# Patient Record
Sex: Female | Born: 1990
Health system: Southern US, Community
[De-identification: ages and names within clinical notes are randomized; demographics above are authoritative.]

## PROBLEM LIST (undated history)

## (undated) DIAGNOSIS — A749 Chlamydial infection, unspecified: Secondary | ICD-10-CM

## (undated) DIAGNOSIS — Z8619 Personal history of other infectious and parasitic diseases: Secondary | ICD-10-CM

## (undated) HISTORY — DX: Personal history of other infectious and parasitic diseases: Z86.19

## (undated) HISTORY — DX: Chlamydial infection, unspecified: A74.9

---

## 2013-09-11 ENCOUNTER — Encounter (HOSPITAL_COMMUNITY): Payer: Self-pay | Admitting: Emergency Medicine

## 2013-09-11 ENCOUNTER — Emergency Department (HOSPITAL_COMMUNITY)
Admission: EM | Admit: 2013-09-11 | Discharge: 2013-09-11 | Disposition: A | Discharge status: Home / Self Care | Payer: 59 | Attending: Emergency Medicine | Admitting: Emergency Medicine

## 2013-09-11 DIAGNOSIS — A499 Bacterial infection, unspecified: Secondary | ICD-10-CM | POA: Insufficient documentation

## 2013-09-11 DIAGNOSIS — Z3202 Encounter for pregnancy test, result negative: Secondary | ICD-10-CM | POA: Insufficient documentation

## 2013-09-11 DIAGNOSIS — B9689 Other specified bacterial agents as the cause of diseases classified elsewhere: Secondary | ICD-10-CM | POA: Insufficient documentation

## 2013-09-11 DIAGNOSIS — N760 Acute vaginitis: Secondary | ICD-10-CM | POA: Insufficient documentation

## 2013-09-11 LAB — URINALYSIS, ROUTINE W REFLEX MICROSCOPIC
Bilirubin Urine: NEGATIVE
Glucose, UA: NEGATIVE mg/dL
Hgb urine dipstick: NEGATIVE
KETONES UR: NEGATIVE mg/dL
Leukocytes, UA: NEGATIVE
Nitrite: NEGATIVE
PROTEIN: NEGATIVE mg/dL
Specific Gravity, Urine: 1.025 (ref 1.005–1.030)
Urobilinogen, UA: 0.2 mg/dL (ref 0.0–1.0)
pH: 5.5 (ref 5.0–8.0)

## 2013-09-11 LAB — POC URINE PREG, ED: PREG TEST UR: NEGATIVE

## 2013-09-11 LAB — WET PREP, GENITAL
TRICH WET PREP: NONE SEEN
YEAST WET PREP: NONE SEEN

## 2013-09-11 MED ORDER — METRONIDAZOLE 500 MG PO TABS: 500.0000 mg | ORAL_TABLET | Freq: Two times a day (BID) | ORAL | Status: DC

## 2013-09-11 NOTE — ED Notes (Signed)
Pt c/o white vaginal discharge with odor and foul smelling urine; pt denies dysuria; sts LMP was in April

## 2013-09-11 NOTE — ED Provider Notes (Signed)
Medical screening examination/treatment/procedure(s) were performed by non-physician practitioner and as supervising physician I was immediately available for consultation/collaboration.   EKG Interpretation None        Gilda Creasehristopher J. Pollina, MD 09/11/13 1422

## 2013-09-11 NOTE — Discharge Instructions (Signed)
Take flagyl twice daily for the next 7 days. Do not have any alcohol intake or contact while taking this drug. Avoid sexual intercourse x 2 weeks.  Bacterial Vaginosis Bacterial vaginosis is a vaginal infection that occurs when the normal balance of bacteria in the vagina is disrupted. It results from an overgrowth of certain bacteria. This is the most common vaginal infection in women of childbearing age. Treatment is important to prevent complications, especially in pregnant women, as it can cause a premature delivery. CAUSES  Bacterial vaginosis is caused by an increase in harmful bacteria that are normally present in smaller amounts in the vagina. Several different kinds of bacteria can cause bacterial vaginosis. However, the reason that the condition develops is not fully understood. RISK FACTORS Certain activities or behaviors can put you at an increased risk of developing bacterial vaginosis, including:  Having a new sex partner or multiple sex partners.  Douching.  Using an intrauterine device (IUD) for contraception. Women do not get bacterial vaginosis from toilet seats, bedding, swimming pools, or contact with objects around them. SIGNS AND SYMPTOMS  Some women with bacterial vaginosis have no signs or symptoms. Common symptoms include:  Grey vaginal discharge.  A fishlike odor with discharge, especially after sexual intercourse.  Itching or burning of the vagina and vulva.  Burning or pain with urination. DIAGNOSIS  Your health care provider will take a medical history and examine the vagina for signs of bacterial vaginosis. A sample of vaginal fluid may be taken. Your health care provider will look at this sample under a microscope to check for bacteria and abnormal cells. A vaginal pH test may also be done.  TREATMENT  Bacterial vaginosis may be treated with antibiotic medicines. These may be given in the form of a pill or a vaginal cream. A second round of antibiotics may  be prescribed if the condition comes back after treatment.  HOME CARE INSTRUCTIONS   Only take over-the-counter or prescription medicines as directed by your health care provider.  If antibiotic medicine was prescribed, take it as directed. Make sure you finish it even if you start to feel better.  Do not have sex until treatment is completed.  Tell all sexual partners that you have a vaginal infection. They should see their health care provider and be treated if they have problems, such as a mild rash or itching.  Practice safe sex by using condoms and only having one sex partner. SEEK MEDICAL CARE IF:   Your symptoms are not improving after 3 days of treatment.  You have increased discharge or pain.  You have a fever. MAKE SURE YOU:   Understand these instructions.  Will watch your condition.  Will get help right away if you are not doing well or get worse. FOR MORE INFORMATION  Centers for Disease Control and Prevention, Division of STD Prevention: [employer]s.co.zawww.cdc.gov/std American Sexual Health Association (ASHA): www.ashastd.org  Document Released: 05/18/2005 Document Revised: 03/08/2013 Document Reviewed: 12/28/2012 [facility] Patient Information 2014 [employer], [place].

## 2013-09-11 NOTE — ED Provider Notes (Signed)
CSN: [id number]     Arrival date & time 09/11/13  1315 History   First MD Initiated Contact with Patient 09/11/13 1321     Chief Complaint  Patient presents with  . Vaginal Discharge  . Urinary Tract Infection     (Consider location/radiation/quality/duration/timing/severity/associated sxs/prior Treatment) HPI Comments: Patient is a 23 year old healthy female who presents to the emergency department complaining of vaginal discharge and foul-smelling urine times one week. Patient states once her menses ended she noticed thick, white malodorous vaginal discharge and a "funky" smell to her urine. Admits to suprapubic pressure without abdominal pain. Admits to increased urinary frequency and urgency. Denies dysuria. No fever or chills. She is sexually active with one partner over the past 9 months with occasional protection. States she is not concerned of any sexually transmitted diseases.  Patient is a 23 y.o. female presenting with vaginal discharge and urinary tract infection. The history is provided by the patient.  Vaginal Discharge Urinary Tract Infection    History reviewed. No pertinent past medical history. History reviewed. No pertinent past surgical history. History reviewed. No pertinent family history. History  Substance Use Topics  . Smoking status: Never Smoker   . Smokeless tobacco: Not on file  . Alcohol Use: Yes   OB History   Grav Para Term Preterm Abortions TAB SAB Ect Mult Living                 Review of Systems  Genitourinary: Positive for urgency, frequency and vaginal discharge.  All other systems reviewed and are negative.     Allergies  Sulfa antibiotics  Home Medications   Current Outpatient Rx  Name  Route  Sig  Dispense  Refill  . ibuprofen (ADVIL,MOTRIN) 200 MG tablet   Oral   Take 400 mg by mouth every 6 (six) hours as needed for moderate pain or cramping.         . Naphazoline-Glycerin (REDNESS RELIEF OP)   Both Eyes   Place 2 drops  into both eyes daily as needed (redness relief).         . metroNIDAZOLE (FLAGYL) 500 MG tablet   Oral   Take 1 tablet (500 mg total) by mouth 2 (two) times daily. One po bid x 7 days   14 tablet   0    BP 160/81  Pulse 80  Temp(Src) 98.7 F (37.1 C) (Oral)  Resp 18  Ht 5\' 2"  (1.575 m)  Wt 160 lb (72.576 kg)  BMI 29.26 kg/m2  SpO2 100% Physical Exam  Nursing note and vitals reviewed. Constitutional: She is oriented to person, place, and time. She appears well-developed and well-nourished. No distress.  HENT:  Head: Normocephalic and atraumatic.  Mouth/Throat: Oropharynx is clear and moist.  Eyes: Conjunctivae are normal.  Neck: Normal range of motion. Neck supple.  Cardiovascular: Normal rate, regular rhythm and normal heart sounds.   Pulmonary/Chest: Effort normal and breath sounds normal.  Abdominal: Soft. Bowel sounds are normal. There is no tenderness.  Genitourinary: Uterus normal. There is no rash, tenderness or lesion on the right labia. There is no rash, tenderness or lesion on the left labia. Cervix exhibits no motion tenderness, no discharge and no friability. Right adnexum displays no mass, no tenderness and no fullness. Left adnexum displays no mass, no tenderness and no fullness. No erythema or bleeding around the vagina. Vaginal discharge (clumpy, white) found.  Musculoskeletal: Normal range of motion. She exhibits no edema.  Neurological: She is alert and oriented to  person, place, and time.  Skin: Skin is warm and dry. She is not diaphoretic.  Psychiatric: She has a normal mood and affect. Her behavior is normal.    ED Course  Procedures (including critical care time) Labs Review Labs Reviewed  WET PREP, GENITAL - Abnormal; Notable for the following:    Clue Cells Wet Prep HPF POC FEW (*)    WBC, Wet Prep HPF POC FEW (*)    All other components within normal limits  URINALYSIS, ROUTINE W REFLEX MICROSCOPIC - Abnormal; Notable for the following:     APPearance HAZY (*)    All other components within normal limits  GC/CHLAMYDIA PROBE AMP  POC URINE PREG, ED   Imaging Review No results found.   EKG Interpretation None      MDM   Final diagnoses:  BV (bacterial vaginosis)   Rx flagyl. No CMT or adnexal tenderness. Abdomen soft and NT. Infection care/precautions discussed. Return precautions given. Patient states understanding of treatment care plan and is agreeable.     Trevor MaceRobyn M Albert, PA-C 09/11/13 1420

## 2013-09-12 LAB — GC/CHLAMYDIA PROBE AMP
CT PROBE, AMP APTIMA: NEGATIVE
GC PROBE AMP APTIMA: NEGATIVE

## 2014-06-01 HISTORY — PX: WISDOM TOOTH EXTRACTION: SHX21

## 2014-06-12 ENCOUNTER — Emergency Department (HOSPITAL_COMMUNITY)
Admission: EM | Admit: 2014-06-12 | Discharge: 2014-06-12 | Disposition: A | Discharge status: Home / Self Care | Payer: 59 | Attending: Emergency Medicine | Admitting: Emergency Medicine

## 2014-06-12 ENCOUNTER — Encounter (HOSPITAL_COMMUNITY): Payer: Self-pay | Admitting: *Deleted

## 2014-06-12 DIAGNOSIS — S161XXA Strain of muscle, fascia and tendon at neck level, initial encounter: Secondary | ICD-10-CM | POA: Diagnosis not present

## 2014-06-12 DIAGNOSIS — S0990XA Unspecified injury of head, initial encounter: Secondary | ICD-10-CM | POA: Diagnosis not present

## 2014-06-12 DIAGNOSIS — M791 Myalgia, unspecified site: Secondary | ICD-10-CM

## 2014-06-12 DIAGNOSIS — Y92410 Unspecified street and highway as the place of occurrence of the external cause: Secondary | ICD-10-CM | POA: Insufficient documentation

## 2014-06-12 DIAGNOSIS — S199XXA Unspecified injury of neck, initial encounter: Secondary | ICD-10-CM | POA: Diagnosis present

## 2014-06-12 DIAGNOSIS — Y9389 Activity, other specified: Secondary | ICD-10-CM | POA: Diagnosis not present

## 2014-06-12 DIAGNOSIS — Y998 Other external cause status: Secondary | ICD-10-CM | POA: Insufficient documentation

## 2014-06-12 NOTE — ED Notes (Signed)
Declined W/C at D/C and was escorted to lobby by RN. 

## 2014-06-12 NOTE — Discharge Instructions (Signed)
Please call your doctor for a followup appointment within 24-48 hours. When you talk to your doctor please let them know that you were seen in the emergency department and have them acquire all of your records so that they can discuss the findings with you and formulate a treatment plan to fully care for your new and ongoing problems. Please call and follow up with orthopedics, Dr. Carola Frost Please rest and stay hydrated Please avoid any physical or strenuous activity Please massage with icy hot ointment and apply heat for muscular discomfort relief Please continue to monitor symptoms closely and if symptoms are to worsen or change (fever greater than 101, chills, sweating, nausea, vomiting, chest pain, shortness of breathe, difficulty breathing, weakness, numbness, tingling, worsening or changes to pain pattern, headaches, dizziness, confusion, visual changes, stomach pain, fall, injury) please report back to the Emergency Department immediately.    Cervical Strain and Sprain (Whiplash) with Rehab Cervical strain and sprain are injuries that commonly occur with "whiplash" injuries. Whiplash occurs when the neck is forcefully whipped backward or forward, such as during a motor vehicle accident or during contact sports. The muscles, ligaments, tendons, discs, and nerves of the neck are susceptible to injury when this occurs. RISK FACTORS Risk of having a whiplash injury increases if:  Osteoarthritis of the spine.  Situations that make head or neck accidents or trauma more likely.  High-risk sports (football, rugby, wrestling, hockey, auto racing, gymnastics, diving, contact karate, or boxing).  Poor strength and flexibility of the neck.  Previous neck injury.  Poor tackling technique.  Improperly fitted or padded equipment. SYMPTOMS   Pain or stiffness in the front or back of neck or both.  Symptoms may present immediately or up to 24 hours after injury.  Dizziness, headache, nausea, and  vomiting.  Muscle spasm with soreness and stiffness in the neck.  Tenderness and swelling at the injury site. PREVENTION  Learn and use proper technique (avoid tackling with the head, spearing, and head-butting; use proper falling techniques to avoid landing on the head).  Warm up and stretch properly before activity.  Maintain physical fitness:  Strength, flexibility, and endurance.  Cardiovascular fitness.  Wear properly fitted and padded protective equipment, such as padded soft collars, for participation in contact sports. PROGNOSIS  Recovery from cervical strain and sprain injuries is dependent on the extent of the injury. These injuries are usually curable in 1 week to 3 months with appropriate treatment.  RELATED COMPLICATIONS   Temporary numbness and weakness may occur if the nerve roots are damaged, and this may persist until the nerve has completely healed.  Chronic pain due to frequent recurrence of symptoms.  Prolonged healing, especially if activity is resumed too soon (before complete recovery). TREATMENT  Treatment initially involves the use of ice and medication to help reduce pain and inflammation. It is also important to perform strengthening and stretching exercises and modify activities that worsen symptoms so the injury does not get worse. These exercises may be performed at home or with a therapist. For patients who experience severe symptoms, a soft, padded collar may be recommended to be worn around the neck.  Improving your posture may help reduce symptoms. Posture improvement includes pulling your chin and abdomen in while sitting or standing. If you are sitting, sit in a firm chair with your buttocks against the back of the chair. While sleeping, try replacing your pillow with a small towel rolled to 2 inches in diameter, or use a cervical pillow or  soft cervical collar. Poor sleeping positions delay healing.  For patients with nerve root damage, which causes  numbness or weakness, the use of a cervical traction apparatus may be recommended. Surgery is rarely necessary for these injuries. However, cervical strain and sprains that are present at birth (congenital) may require surgery. MEDICATION   If pain medication is necessary, nonsteroidal anti-inflammatory medications, such as aspirin and ibuprofen, or other minor pain relievers, such as acetaminophen, are often recommended.  Do not take pain medication for 7 days before surgery.  Prescription pain relievers may be given if deemed necessary by your caregiver. Use only as directed and only as much as you need. HEAT AND COLD:   Cold treatment (icing) relieves pain and reduces inflammation. Cold treatment should be applied for 10 to 15 minutes every 2 to 3 hours for inflammation and pain and immediately after any activity that aggravates your symptoms. Use ice packs or an ice massage.  Heat treatment may be used prior to performing the stretching and strengthening activities prescribed by your caregiver, physical therapist, or athletic trainer. Use a heat pack or a warm soak. SEEK MEDICAL CARE IF:   Symptoms get worse or do not improve in 2 weeks despite treatment.  New, unexplained symptoms develop (drugs used in treatment may produce side effects). EXERCISES RANGE OF MOTION (ROM) AND STRETCHING EXERCISES - Cervical Strain and Sprain These exercises may help you when beginning to rehabilitate your injury. In order to successfully resolve your symptoms, you must improve your posture. These exercises are designed to help reduce the forward-head and rounded-shoulder posture which contributes to this condition. Your symptoms may resolve with or without further involvement from your physician, physical therapist or athletic trainer. While completing these exercises, remember:   Restoring tissue flexibility helps normal motion to return to the joints. This allows healthier, less painful movement and  activity.  An effective stretch should be held for at least 20 seconds, although you may need to begin with shorter hold times for comfort.  A stretch should never be painful. You should only feel a gentle lengthening or release in the stretched tissue. STRETCH- Axial Extensors  Lie on your back on the floor. You may bend your knees for comfort. Place a rolled-up hand towel or dish towel, about 2 inches in diameter, under the part of your head that makes contact with the floor.  Gently tuck your chin, as if trying to make a "double chin," until you feel a gentle stretch at the base of your head.  Hold __________ seconds. Repeat __________ times. Complete this exercise __________ times per day.  STRETCH - Axial Extension   Stand or sit on a firm surface. Assume a good posture: chest up, shoulders drawn back, abdominal muscles slightly tense, knees unlocked (if standing) and feet hip width apart.  Slowly retract your chin so your head slides back and your chin slightly lowers. Continue to look straight ahead.  You should feel a gentle stretch in the back of your head. Be certain not to feel an aggressive stretch since this can cause headaches later.  Hold for __________ seconds. Repeat __________ times. Complete this exercise __________ times per day. STRETCH - Cervical Side Bend   Stand or sit on a firm surface. Assume a good posture: chest up, shoulders drawn back, abdominal muscles slightly tense, knees unlocked (if standing) and feet hip width apart.  Without letting your nose or shoulders move, slowly tip your right / left ear to your shoulder until  your feel a gentle stretch in the muscles on the opposite side of your neck.  Hold __________ seconds. Repeat __________ times. Complete this exercise __________ times per day. STRETCH - Cervical Rotators   Stand or sit on a firm surface. Assume a good posture: chest up, shoulders drawn back, abdominal muscles slightly tense, knees  unlocked (if standing) and feet hip width apart.  Keeping your eyes level with the ground, slowly turn your head until you feel a gentle stretch along the back and opposite side of your neck.  Hold __________ seconds. Repeat __________ times. Complete this exercise __________ times per day. RANGE OF MOTION - Neck Circles   Stand or sit on a firm surface. Assume a good posture: chest up, shoulders drawn back, abdominal muscles slightly tense, knees unlocked (if standing) and feet hip width apart.  Gently roll your head down and around from the back of one shoulder to the back of the other. The motion should never be forced or painful.  Repeat the motion 10-20 times, or until you feel the neck muscles relax and loosen. Repeat __________ times. Complete the exercise __________ times per day. STRENGTHENING EXERCISES - Cervical Strain and Sprain These exercises may help you when beginning to rehabilitate your injury. They may resolve your symptoms with or without further involvement from your physician, physical therapist, or athletic trainer. While completing these exercises, remember:   Muscles can gain both the endurance and the strength needed for everyday activities through controlled exercises.  Complete these exercises as instructed by your physician, physical therapist, or athletic trainer. Progress the resistance and repetitions only as guided.  You may experience muscle soreness or fatigue, but the pain or discomfort you are trying to eliminate should never worsen during these exercises. If this pain does worsen, stop and make certain you are following the directions exactly. If the pain is still present after adjustments, discontinue the exercise until you can discuss the trouble with your clinician. STRENGTH - Cervical Flexors, Isometric  Face a wall, standing about 6 inches away. Place a small pillow, a ball about 6-8 inches in diameter, or a folded towel between your forehead and the  wall.  Slightly tuck your chin and gently push your forehead into the soft object. Push only with mild to moderate intensity, building up tension gradually. Keep your jaw and forehead relaxed.  Hold 10 to 20 seconds. Keep your breathing relaxed.  Release the tension slowly. Relax your neck muscles completely before you start the next repetition. Repeat __________ times. Complete this exercise __________ times per day. STRENGTH- Cervical Lateral Flexors, Isometric   Stand about 6 inches away from a wall. Place a small pillow, a ball about 6-8 inches in diameter, or a folded towel between the side of your head and the wall.  Slightly tuck your chin and gently tilt your head into the soft object. Push only with mild to moderate intensity, building up tension gradually. Keep your jaw and forehead relaxed.  Hold 10 to 20 seconds. Keep your breathing relaxed.  Release the tension slowly. Relax your neck muscles completely before you start the next repetition. Repeat __________ times. Complete this exercise __________ times per day. STRENGTH - Cervical Extensors, Isometric   Stand about 6 inches away from a wall. Place a small pillow, a ball about 6-8 inches in diameter, or a folded towel between the back of your head and the wall.  Slightly tuck your chin and gently tilt your head back into the soft object.  Push only with mild to moderate intensity, building up tension gradually. Keep your jaw and forehead relaxed.  Hold 10 to 20 seconds. Keep your breathing relaxed.  Release the tension slowly. Relax your neck muscles completely before you start the next repetition. Repeat __________ times. Complete this exercise __________ times per day. POSTURE AND BODY MECHANICS CONSIDERATIONS - Cervical Strain and Sprain Keeping correct posture when sitting, standing or completing your activities will reduce the stress put on different body tissues, allowing injured tissues a chance to heal and limiting  painful experiences. The following are general guidelines for improved posture. Your physician or physical therapist will provide you with any instructions specific to your needs. While reading these guidelines, remember:  The exercises prescribed by your provider will help you have the flexibility and strength to maintain correct postures.  The correct posture provides the optimal environment for your joints to work. All of your joints have less wear and tear when properly supported by a spine with good posture. This means you will experience a healthier, less painful body.  Correct posture must be practiced with all of your activities, especially prolonged sitting and standing. Correct posture is as important when doing repetitive low-stress activities (typing) as it is when doing a single heavy-load activity (lifting). PROLONGED STANDING WHILE SLIGHTLY LEANING FORWARD When completing a task that requires you to lean forward while standing in one place for a long time, place either foot up on a stationary 2- to 4-inch high object to help maintain the best posture. When both feet are on the ground, the low back tends to lose its slight inward curve. If this curve flattens (or becomes too large), then the back and your other joints will experience too much stress, fatigue more quickly, and can cause pain.  RESTING POSITIONS Consider which positions are most painful for you when choosing a resting position. If you have pain with flexion-based activities (sitting, bending, stooping, squatting), choose a position that allows you to rest in a less flexed posture. You would want to avoid curling into a fetal position on your side. If your pain worsens with extension-based activities (prolonged standing, working overhead), avoid resting in an extended position such as sleeping on your stomach. Most people will find more comfort when they rest with their spine in a more neutral position, neither too rounded nor  too arched. Lying on a non-sagging bed on your side with a pillow between your knees, or on your back with a pillow under your knees will often provide some relief. Keep in mind, being in any one position for a prolonged period of time, no matter how correct your posture, can still lead to stiffness. WALKING Walk with an upright posture. Your ears, shoulders, and hips should all line up. OFFICE WORK When working at a desk, create an environment that supports good, upright posture. Without extra support, muscles fatigue and lead to excessive strain on joints and other tissues. CHAIR:  A chair should be able to slide under your desk when your back makes contact with the back of the chair. This allows you to work closely.  The chair's height should allow your eyes to be level with the upper part of your monitor and your hands to be slightly lower than your elbows.  Body position:  Your feet should make contact with the floor. If this is not possible, use a foot rest.  Keep your ears over your shoulders. This will reduce stress on your neck and low back.  Document Released: 05/18/2005 Document Revised: 10/02/2013 Document Reviewed: 08/30/2008 [facility] Patient Information 2015 [place], [place]. This information is not intended to replace advice given to you by your health care provider. Make sure you discuss any questions you have with your health care provider.   Emergency Department Resource Guide 1) Find a Doctor and Pay Out of Pocket Although you won't have to find out who is covered by your insurance plan, it is a good idea to ask around and get recommendations. You will then need to call the office and see if the doctor you have chosen will accept you as a new patient and what types of options they offer for patients who are self-pay. Some doctors offer discounts or will set up payment plans for their patients who do not have insurance, but you will need to ask so you aren't surprised when you  get to your appointment.  2) Contact Your Local Health Department Not all health departments have doctors that can see patients for sick visits, but many do, so it is worth a call to see if yours does. If you don't know where your local health department is, you can check in your phone book. The CDC also has a tool to help you locate your state's health department, and many state websites also have listings of all of their local health departments.  3) Find a Walk-in Clinic If your illness is not likely to be very severe or complicated, you may want to try a walk in clinic. These are popping up all over the country in pharmacies, drugstores, and shopping centers. They're usually staffed by nurse practitioners or physician assistants that have been trained to treat common illnesses and complaints. They're usually fairly quick and inexpensive. However, if you have serious medical issues or chronic medical problems, these are probably not your best option.  No Primary Care Doctor: - Call Health Connect at  [phone] - they can help you locate a primary care doctor that  accepts your insurance, provides certain services, etc. - Physician Referral Service- [phone]  Chronic Pain Problems: Organization         Address  Phone   Notes  [facility]  [phone] Patients need to be referred by their primary care doctor.   Medication Assistance: Organization         Address  Phone   Notes  [facility] Medication [facility] [address] [place] [place], [place] [address] [phone] --Must be a resident of [facility] -- Must have NO insurance coverage whatsoever (no Medicaid/ Medicare, etc.) -- The pt. MUST have a primary care doctor that directs their care regularly and follows them in the community   [facility]  [phone]   [employer]  [phone]    Agencies that provide inexpensive medical care: Organization         Address  Phone    Notes  [facility]  [phone]   [facility]    [phone]   [facility] [address] [place], [place] [address] [phone]   [facility] [address] [place] [phone]   [facility]    [phone]   [facility]    [phone]   [facility] and [facility]  [address], [place] Phone:  [phone], Fax:  [phone] Hours of Operation:  9 am - 6 pm, M-F.  Also accepts Medicaid/Medicare and self-pay.  [facility]  [address], [place] Phone: [phone], Fax: [phone]. Hours of Operation:  8:30 am - 5:30 pm, M-F.  Also accepts Medicaid and self-pay.  [facility] [place] [address], [place] Phone: [phone]   [facility] [address] Natasha Bence [place], [place] [phone], Ext. [phone] Mondays & Thursdays: 7-9 AM.  First 15 patients are seen on a first come, first serve basis.    Medicaid-accepting [facility] Providers:  Organization         Address  Phone   Notes  [facility] [address], [place] [phone] Also accepts self-pay patients.  [facility] [address] Laurell Josephs [place], [place]  [phone]   [facility] [address], [place] [phone]   [facility] [address], [place] [phone]   Renaye Rakers [address], [place]   [phone] Only accepts [place] patients after they have their name applied to their card.   Self-Pay (no insurance) in [facility]:  Organization         Address  Phone   Notes  Sickle Cell Patients, [facility] [address] [place], [place] [phone]   [facility] [address] [place], [place] [phone]   [facility] [place]  [address], [place] [phone]   [facility]/Dr. Osei-Bonsu  [address], [place] or [address], [place] [phone] Phone number for both [place] and [place] locations is the same.  [facility] [address], [place] [phone]   [facility] [address] [place] or [address] [phone]   [facility] [address], [place] [phone], phone; [phone], fax Sees patients 1st and 3rd Saturday of every month.  Must not qualify for public or private insurance (i.e. Medicaid, Medicare, [place] Health Choice, Veterans' Benefits)  Household income should be no more than 200% of the poverty level The clinic cannot treat you if you are pregnant or think you are pregnant  Sexually transmitted diseases are not treated at the clinic.    Dental Care: Organization         Address  Phone  Notes  [facility] [address] [place], [place] [phone] Accepts children up to age 77 who are enrolled in [place] or [place] Health Choice; pregnant women with a Medicaid card; and children who have applied for Medicaid or [place] Health Choice, but were declined, whose parents can pay a reduced fee at time of service.  [facility]  [address], [place] [phone] Accepts children up to age 25 who are enrolled in [place] or [place] Health Choice; pregnant women with a Medicaid card; and children who have applied for Medicaid or [place] Health Choice, but were declined, whose parents can pay a reduced fee at time of service.  [facility]  [address] [place], [place] [phone] Patients are seen by appointment only. Walk-ins are not accepted. [facility] will see patients 70 years of age  and older. Monday - Tuesday (  8am-5pm) Most Wednesdays (8:30-5pm) $30 per visit, cash only  [facility]  [address], [facility] [phone] Patients are seen by appointment only. Walk-ins are not accepted. [facility] will see patients 45 years of age and older. One Wednesday Evening (Monthly: Volunteer Based).  $30 per visit, cash only  [employer]  [phone] for adults; Children under age 49, call Graduate Pediatric Dentistry at [phone]. Children aged 62-14, please call [phone] to request a pediatric application.  Dental services are provided in all areas of dental care including fillings, crowns and bridges, complete and partial dentures, implants, gum treatment, root canals, and extractions. Preventive care is also provided. Treatment is provided to both adults and children. Patients are selected via a lottery and there is often a waiting list.   [facility] [address] [place]  [phone] [email or web address]   [facility] [address] [place], [place] [phone], Ext. [phone] Second and Fourth Thursday of each month, opens at 6:30 AM; Clinic ends at 9 AM.  Patients are seen on a first-come first-served basis, and a limited number are seen during each clinic.   [facility]  [address] Ether Griffins [place], [place] [phone]   Eligibility Requirements You must have lived in [place], [place], or [place] counties for at least the last three months.   You cannot be eligible for state or federal sponsored [place], including [employer], [place], or [employer].   You generally cannot be eligible for healthcare insurance through your employer.    How to apply: Eligibility screenings are held every Tuesday and Wednesday afternoon from 1:00 pm until 4:00 pm. You do not need an appointment for the interview!  [facility] [address] [place], [place] [phone]   [facility] Health Department  [phone]   [facility] Health Department  [phone]   [facility] Health Department  [phone]    Behavioral Health Resources in the Community: Intensive Outpatient Programs Organization         Address  Phone  Notes  [facility] [address], [place], [place] [phone]   [facility] [address] [place], [place] [phone]   [facility] [address] [place], [place]  [phone]   [facility] [address]  [place], [place] [phone] or [phone]   Substance Abuse Resources Organization         Address  Phone  Notes  [facility]  [phone]   [facility]  [phone]   [facility]  [phone]   Floydene Flock  [phone]   Residential & Outpatient Substance Abuse Program  [phone]   Psychological Services Organization         Address  Phone  Notes  [facility]  [phone]   [facility]  [phone]   [facility] [address] [place] [phone] or [phone]    Mobile Crisis Teams Organization         Address  Phone  Notes  [facility], Mobile Crisis Care Unit  [phone]   [facility]  [address] [place], [place] [phone]   Doristine Locks [address] [place] [phone]    Self-Help/Support Groups Organization         Address  Phone  Notes  [facility] [place] - variety of support groups  [phone] Call for more information  Narcotics Anonymous (NA), [facility] [address], [employer] [place]  2 meetings at this location   Statistician         Address  Phone  Notes  [facility] [address],    [place]  [phone]   [facility]  [address], [place] [address], [place], [place] [phone]   [facility] [address] [place], [place] [phone] Admissions: 8am-3pm M-F  [facility] [address],    [place], [place] [phone]   [facility] [address] [place], [place], [place] [phone]   [facility] [address],  [place], [place] [phone]   [facility] - Intensive Outpatient [address], [place], [place] [phone]   [facility] ([facility] [address] [place]  [place], [place] [phone] or [phone]   [facility] [address], [place], [place] [phone] Accepts Medicaid  [facility] [address],  [place] [phone] Substance Abuse/Addiction Treatment   [facility] Organization         Address  Phone  Notes  [facility]  [phone]   Angie Fava, PhD [address] Kn[address] [place], [place]   [phone] or [phone]   [facility]   [address] [place], [place] [phone]   [facility] [address], [place], [place] [phone] Insurance/Medicaid/sponsorship through [facility] and Families [address]                                    [place], [place] [phone] Therapy/tele-psych/case  [facility] [address], [place] [phone]    Dr. Lolly Mustache  [phone]   [facility]  [employer] [facility] Dept. 1) [address], [place] 2) [address], [place] 3)  [address], [place] [phone]  [phone]   [facility] Child Abuse Hotline [phone] or [phone] (After Hours)
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
## 2014-08-08 ENCOUNTER — Inpatient Hospital Stay (HOSPITAL_COMMUNITY)
Admission: AD | Admit: 2014-08-08 | Discharge: 2014-08-08 | Disposition: A | Discharge status: Home / Self Care | Payer: 59 | Source: Ambulatory Visit | Attending: Family Medicine | Admitting: Family Medicine

## 2014-08-08 ENCOUNTER — Encounter (HOSPITAL_COMMUNITY): Payer: Self-pay | Admitting: *Deleted

## 2014-08-08 DIAGNOSIS — N898 Other specified noninflammatory disorders of vagina: Secondary | ICD-10-CM | POA: Diagnosis present

## 2014-08-08 LAB — URINALYSIS, ROUTINE W REFLEX MICROSCOPIC
Bilirubin Urine: NEGATIVE
Glucose, UA: NEGATIVE mg/dL
Hgb urine dipstick: NEGATIVE
Ketones, ur: NEGATIVE mg/dL
Nitrite: NEGATIVE
PH: 6 (ref 5.0–8.0)
PROTEIN: NEGATIVE mg/dL
Specific Gravity, Urine: >1.03 — ABNORMAL HIGH (ref 1.005–1.030)
Urobilinogen, UA: 0.2 mg/dL (ref 0.0–1.0)

## 2014-08-08 LAB — POCT PREGNANCY, URINE: Preg Test, Ur: NEGATIVE

## 2014-08-08 LAB — WET PREP, GENITAL
TRICH WET PREP: NONE SEEN
WBC WET PREP: NONE SEEN
YEAST WET PREP: NONE SEEN

## 2014-08-08 LAB — URINE MICROSCOPIC-ADD ON

## 2014-08-08 NOTE — Discharge Instructions (Signed)
Bacterial Vaginosis Bacterial vaginosis is a vaginal infection that occurs when the normal balance of bacteria in the vagina is disrupted. It results from an overgrowth of certain bacteria. This is the most common vaginal infection in women of childbearing age. Treatment is important to prevent complications, especially in pregnant women, as it can cause a premature delivery. CAUSES  Bacterial vaginosis is caused by an increase in harmful bacteria that are normally present in smaller amounts in the vagina. Several different kinds of bacteria can cause bacterial vaginosis. However, the reason that the condition develops is not fully understood. RISK FACTORS Certain activities or behaviors can put you at an increased risk of developing bacterial vaginosis, including:  Having a new sex partner or multiple sex partners.  Douching.  Using an intrauterine device (IUD) for contraception. Women do not get bacterial vaginosis from toilet seats, bedding, swimming pools, or contact with objects around them. SIGNS AND SYMPTOMS  Some women with bacterial vaginosis have no signs or symptoms. Common symptoms include:  Grey vaginal discharge.  A fishlike odor with discharge, especially after sexual intercourse.  Itching or burning of the vagina and vulva.  Burning or pain with urination. DIAGNOSIS  Your health care provider will take a medical history and examine the vagina for signs of bacterial vaginosis. A sample of vaginal fluid may be taken. Your health care provider will look at this sample under a microscope to check for bacteria and abnormal cells. A vaginal pH test may also be done.  TREATMENT  Bacterial vaginosis may be treated with antibiotic medicines. These may be given in the form of a pill or a vaginal cream. A second round of antibiotics may be prescribed if the condition comes back after treatment.  HOME CARE INSTRUCTIONS   Only take over-the-counter or prescription medicines as  directed by your health care provider.  If antibiotic medicine was prescribed, take it as directed. Make sure you finish it even if you start to feel better.  Do not have sex until treatment is completed.  Tell all sexual partners that you have a vaginal infection. They should see their health care provider and be treated if they have problems, such as a mild rash or itching.  Practice safe sex by using condoms and only having one sex partner. SEEK MEDICAL CARE IF:   Your symptoms are not improving after 3 days of treatment.  You have increased discharge or pain.  You have a fever. MAKE SURE YOU:   Understand these instructions.  Will watch your condition.  Will get help right away if you are not doing well or get worse. FOR MORE INFORMATION  Centers for Disease Control and Prevention, Division of STD Prevention: www.cdc.gov/std American Sexual Health Association (ASHA): www.ashastd.org  Document Released: 05/18/2005 Document Revised: 03/08/2013 Document Reviewed: 12/28/2012 ExitCare Patient Information 2015 [employer]. This information is not intended to replace advice given to you by your health care provider. Make sure you discuss any questions you have with your health care provider.  

## 2014-08-08 NOTE — MAU Provider Note (Signed)
  History     CSN: [id number]  Arrival date and time: 08/08/14 1645   First Provider Initiated Contact with Patient 08/08/14 1723      No chief complaint on file. yellow, vaginal discharge HPI Teresa Walls 24 y.o. G0P0000  Nonpregnant female presents to MAU complaining of vaginal discharge that was noticed yesterday - 1 day after her period was completed.  It is yellow discharge with a bit of an odor.  She denies bleeding, abdominal pain, dysuria.   OB History    Gravida Para Term Preterm AB TAB SAB Ectopic Multiple Living   0 0 0 0 0 0 0 0 0 0       Past Medical History  Diagnosis Date  . Medical history non-contributory     Past Surgical History  Procedure Laterality Date  . No past surgeries      History reviewed. No pertinent family history.  History  Substance Use Topics  . Smoking status: Never Smoker   . Smokeless tobacco: Never Used  . Alcohol Use: Yes     Comment: social    Allergies:  Allergies  Allergen Reactions  . Sulfa Antibiotics Hives    Fever, redness    Prescriptions prior to admission  Medication Sig Dispense Refill Last Dose  . ibuprofen (ADVIL,MOTRIN) 200 MG tablet Take 400 mg by mouth every 6 (six) hours as needed for moderate pain or cramping.   Past Week at Unknown time  . metroNIDAZOLE (FLAGYL) 500 MG tablet Take 1 tablet (500 mg total) by mouth 2 (two) times daily. One po bid x 7 days (Patient not taking: Reported on 08/08/2014) 14 tablet 0   . Naphazoline-Glycerin (REDNESS RELIEF OP) Place 2 drops into both eyes daily as needed (redness relief).   more than one month    ROS Pertinent ROS in HPI  Physical Exam   Blood pressure 149/94, pulse 103, temperature 98.6 F (37 C), temperature source Oral, resp. rate 18, height 5\' 3"  (1.6 m), weight 168 lb 3.2 oz (76.295 kg), last menstrual period 08/01/2014.  Physical Exam  Constitutional: She is oriented to person, place, and time. She appears well-developed and well-nourished. No distress.   HENT:  Head: Normocephalic and atraumatic.  Eyes: EOM are normal.  Neck: Normal range of motion.  Cardiovascular: Normal rate, regular rhythm and normal heart sounds.   Respiratory: Effort normal and breath sounds normal. No respiratory distress.  GI: Soft. Bowel sounds are normal. She exhibits no distension. There is no tenderness. There is no rebound and no guarding.  Genitourinary:  Scant white discharge Cervix is pink, smooth No CMT, no adnexal mass or tenderness  Musculoskeletal: Normal range of motion.  Neurological: She is alert and oriented to person, place, and time.  Skin: Skin is warm and dry.  Psychiatric: She has a normal mood and affect.   Pt declines HIV testing  MAU Course  Procedures  MDM Scant discharge Wet prep not definitive for infection  Assessment and Plan  A:  1. Vaginal discharge    P: discharge to home Follow up at health department Increase fluids  Patient may return to MAU as needed or if her condition were to change or worsen   Bertram Denvereague Clark, Karen E 08/08/2014, 5:26 PM

## 2014-08-08 NOTE — MAU Note (Signed)
Pt complains of vaginal yellow discharge. Denies vaginal bleeding or pain.

## 2014-08-09 LAB — GC/CHLAMYDIA PROBE AMP (~~LOC~~) NOT AT ARMC
CHLAMYDIA, DNA PROBE: NEGATIVE
Neisseria Gonorrhea: NEGATIVE

## 2015-01-31 DIAGNOSIS — A749 Chlamydial infection, unspecified: Secondary | ICD-10-CM

## 2015-01-31 HISTORY — DX: Chlamydial infection, unspecified: A74.9

## 2015-02-01 ENCOUNTER — Encounter (HOSPITAL_COMMUNITY): Payer: Self-pay | Admitting: *Deleted

## 2015-02-01 ENCOUNTER — Inpatient Hospital Stay (HOSPITAL_COMMUNITY)
Admission: AD | Admit: 2015-02-01 | Discharge: 2015-02-01 | Disposition: A | Discharge status: Home / Self Care | Payer: 59 | Source: Ambulatory Visit | Attending: Obstetrics & Gynecology | Admitting: Obstetrics & Gynecology

## 2015-02-01 DIAGNOSIS — B9689 Other specified bacterial agents as the cause of diseases classified elsewhere: Secondary | ICD-10-CM

## 2015-02-01 DIAGNOSIS — R102 Pelvic and perineal pain: Secondary | ICD-10-CM | POA: Diagnosis not present

## 2015-02-01 DIAGNOSIS — R350 Frequency of micturition: Secondary | ICD-10-CM | POA: Diagnosis not present

## 2015-02-01 DIAGNOSIS — A499 Bacterial infection, unspecified: Secondary | ICD-10-CM | POA: Diagnosis not present

## 2015-02-01 DIAGNOSIS — N760 Acute vaginitis: Secondary | ICD-10-CM | POA: Insufficient documentation

## 2015-02-01 DIAGNOSIS — O2340 Unspecified infection of urinary tract in pregnancy, unspecified trimester: Secondary | ICD-10-CM

## 2015-02-01 DIAGNOSIS — B951 Streptococcus, group B, as the cause of diseases classified elsewhere: Secondary | ICD-10-CM

## 2015-02-01 LAB — WET PREP, GENITAL
Trich, Wet Prep: NONE SEEN
Yeast Wet Prep HPF POC: NONE SEEN

## 2015-02-01 LAB — URINALYSIS, ROUTINE W REFLEX MICROSCOPIC
Bilirubin Urine: NEGATIVE
Glucose, UA: NEGATIVE mg/dL
Hgb urine dipstick: NEGATIVE
Ketones, ur: NEGATIVE mg/dL
NITRITE: NEGATIVE
Protein, ur: NEGATIVE mg/dL
SPECIFIC GRAVITY, URINE: 1.015 (ref 1.005–1.030)
UROBILINOGEN UA: 0.2 mg/dL (ref 0.0–1.0)
pH: 7 (ref 5.0–8.0)

## 2015-02-01 LAB — URINE MICROSCOPIC-ADD ON

## 2015-02-01 LAB — POCT PREGNANCY, URINE: Preg Test, Ur: NEGATIVE

## 2015-02-01 MED ORDER — IBUPROFEN 600 MG PO TABS: 600.0000 mg | ORAL_TABLET | Freq: Four times a day (QID) | ORAL | Status: DC | PRN

## 2015-02-01 MED ORDER — METRONIDAZOLE 500 MG PO TABS
500.0000 mg | ORAL_TABLET | Freq: Two times a day (BID) | ORAL | Status: DC
Start: 2015-02-01 — End: 2017-02-19

## 2015-02-01 NOTE — MAU Provider Note (Signed)
Chief Complaint: pelvic pressure  and Urinary Frequency   None     SUBJECTIVE HPI: Teresa Walls is a 24 y.o. G0P0000 who presents to maternity admissions reporting onset of pelvic pressure and white vaginal discharge 1 week ago before her period started.  Now that her period has finished, she continues to have pelvic pressure and now has urinary frequency.  She denies dysuria.   She denies vaginal bleeding, vaginal itching/burning, h/a, dizziness, n/v, or fever/chills.     Urinary Frequency  This is a new problem. The current episode started yesterday. The problem occurs intermittently. The problem has been unchanged. The pain is mild. There has been no fever. Associated symptoms include frequency and urgency. Pertinent negatives include no chills, discharge, flank pain, nausea or vomiting. She has tried nothing for the symptoms.  Pelvic Pain The patient's primary symptoms include pelvic pain. The patient's pertinent negatives include no vaginal discharge. This is a new problem. The current episode started 1 to 4 weeks ago. The problem occurs intermittently. The problem has been waxing and waning. The pain is mild. She is not pregnant. Associated symptoms include frequency and urgency. Pertinent negatives include no back pain, chills, constipation, diarrhea, dysuria, fever, flank pain, headaches, nausea or vomiting. The vaginal discharge was white and thin. There has been no bleeding. She has not been passing clots. She has not been passing tissue. Nothing aggravates the symptoms. She has tried nothing for the symptoms. She is sexually active. No, her partner does not have an STD.    Past Medical History  Diagnosis Date  . Medical history non-contributory    Past Surgical History  Procedure Laterality Date  . No past surgeries     Social History   Social History  . Marital Status: Single    Spouse Name: N/A  . Number of Children: N/A  . Years of Education: N/A   Occupational History   . Not on file.   Social History Main Topics  . Smoking status: Never Smoker   . Smokeless tobacco: Never Used  . Alcohol Use: Yes     Comment: social  . Drug Use: No  . Sexual Activity: Yes    Birth Control/ Protection: None   Other Topics Concern  . Not on file   Social History Narrative   No current facility-administered medications on file prior to encounter.   Current Outpatient Prescriptions on File Prior to Encounter  Medication Sig Dispense Refill  . ibuprofen (ADVIL,MOTRIN) 200 MG tablet Take 400 mg by mouth every 6 (six) hours as needed for moderate pain or cramping.    . Naphazoline-Glycerin (REDNESS RELIEF OP) Place 2 drops into both eyes daily as needed (redness relief).     Allergies  Allergen Reactions  . Sulfa Antibiotics Hives    Fever, redness    ROS:  Review of Systems  Constitutional: Negative for fever, chills and fatigue.  HENT: Negative for sinus pressure.   Eyes: Negative for photophobia.  Respiratory: Negative for shortness of breath.   Cardiovascular: Negative for chest pain.  Gastrointestinal: Negative for nausea, vomiting, diarrhea and constipation.  Genitourinary: Positive for urgency, frequency and pelvic pain. Negative for dysuria, flank pain, vaginal bleeding, vaginal discharge, difficulty urinating and vaginal pain.  Musculoskeletal: Negative for back pain and neck pain.  Neurological: Negative for dizziness, weakness and headaches.  Psychiatric/Behavioral: Negative.      I have reviewed patient's Past Medical Hx, Surgical Hx, Family Hx, Social Hx, medications and allergies.   Physical Exam  Patient Vitals for the past 24 hrs:  BP Temp Temp src Pulse Resp  02/01/15 1901 130/88 mmHg 98 F (36.7 C) Oral 102 18   Constitutional: Well-developed, well-nourished female in no acute distress.  Cardiovascular: normal rate Respiratory: normal effort GI: Abd soft, non-tender. Pos BS x 4 MS: Extremities nontender, no edema, normal  ROM Neurologic: Alert and oriented x 4.  GU: Neg CVAT.  PELVIC EXAM: Cervix pink, visually closed, without lesion, scant white thin discharge, vaginal walls and external genitalia normal Bimanual exam: Cervix 0/long/high, firm, anterior, neg CMT, uterus nontender, nonenlarged, adnexa without tenderness, enlargement, or mass   LAB RESULTS Results for orders placed or performed during the hospital encounter of 02/01/15 (from the past 24 hour(s))  Urinalysis, Routine w reflex microscopic (not at [facility])     Status: Abnormal   Collection Time: 02/01/15  7:04 PM  Result Value Ref Range   Color, Urine YELLOW YELLOW   APPearance CLEAR CLEAR   Specific Gravity, Urine 1.015 1.005 - 1.030   pH 7.0 5.0 - 8.0   Glucose, UA NEGATIVE NEGATIVE mg/dL   Hgb urine dipstick NEGATIVE NEGATIVE   Bilirubin Urine NEGATIVE NEGATIVE   Ketones, ur NEGATIVE NEGATIVE mg/dL   Protein, ur NEGATIVE NEGATIVE mg/dL   Urobilinogen, UA 0.2 0.0 - 1.0 mg/dL   Nitrite NEGATIVE NEGATIVE   Leukocytes, UA SMALL (A) NEGATIVE  Urine microscopic-add on     Status: Abnormal   Collection Time: 02/01/15  7:04 PM  Result Value Ref Range   Squamous Epithelial / LPF RARE RARE   WBC, UA 7-10 <3 WBC/hpf   RBC / HPF 0-2 <3 RBC/hpf   Bacteria, UA FEW (A) RARE  Pregnancy, urine POC     Status: None   Collection Time: 02/01/15  7:29 PM  Result Value Ref Range   Preg Test, Ur NEGATIVE NEGATIVE  Wet prep, genital     Status: Abnormal   Collection Time: 02/01/15  8:10 PM  Result Value Ref Range   Yeast Wet Prep HPF POC NONE SEEN NONE SEEN   Trich, Wet Prep NONE SEEN NONE SEEN   Clue Cells Wet Prep HPF POC FEW (A) NONE SEEN   WBC, Wet Prep HPF POC FEW (A) NONE SEEN       IMAGING No results found.  MAU Management/MDM: Ordered labs and reviewed results.  Pt stable at time of discharge.  ASSESSMENT 1. Bacterial vaginosis   2. Pelvic pain in female   3. Urinary frequency     PLAN Discharge home Urine sent for  culture Flagyl 500 mg BID x 7 days Ibuprofen 600 mg Q 6 hours PRN Return to MAU as needed for emergencies    Medication List    TAKE these medications        ibuprofen 600 MG tablet  Commonly known as:  ADVIL,MOTRIN  Take 1 tablet (600 mg total) by mouth every 6 (six) hours as needed.     metroNIDAZOLE 500 MG tablet  Commonly known as:  FLAGYL  Take 1 tablet (500 mg total) by mouth 2 (two) times daily.     REDNESS RELIEF OP  Place 2 drops into both eyes daily as needed (redness relief).       Follow-up Information    Follow up with [facility] [place] [facility].   Why:  As needed for emergencies   Contact information:   [address] [id number] mc [place] [address] [phone]      Misty Stanley  [place] Certified Nurse-Midwife 02/01/2015  8:39 PM

## 2015-02-01 NOTE — MAU Note (Addendum)
Pt C/O pelvic pressure since yesterday, having urinary frequency - denies dysuria.  Pt finished her period yesterday.  Pt also C/O white discharge for the last week, denies odor or itching.

## 2015-02-01 NOTE — Discharge Instructions (Signed)
Bacterial Vaginosis °Bacterial vaginosis is a vaginal infection that occurs when the normal balance of bacteria in the vagina is disrupted. It results from an overgrowth of certain bacteria. This is the most common vaginal infection in women of childbearing age. Treatment is important to prevent complications, especially in pregnant women, as it can cause a premature delivery. °CAUSES  °Bacterial vaginosis is caused by an increase in harmful bacteria that are normally present in smaller amounts in the vagina. Several different kinds of bacteria can cause bacterial vaginosis. However, the reason that the condition develops is not fully understood. °RISK FACTORS °Certain activities or behaviors can put you at an increased risk of developing bacterial vaginosis, including: °· Having a new sex partner or multiple sex partners. °· Douching. °· Using an intrauterine device (IUD) for contraception. °Women do not get bacterial vaginosis from toilet seats, bedding, swimming pools, or contact with objects around them. °SIGNS AND SYMPTOMS  °Some women with bacterial vaginosis have no signs or symptoms. Common symptoms include: °· Grey vaginal discharge. °· A fishlike odor with discharge, especially after sexual intercourse. °· Itching or burning of the vagina and vulva. °· Burning or pain with urination. °DIAGNOSIS  °Your health care provider will take a medical history and examine the vagina for signs of bacterial vaginosis. A sample of vaginal fluid may be taken. Your health care provider will look at this sample under a microscope to check for bacteria and abnormal cells. A vaginal pH test may also be done.  °TREATMENT  °Bacterial vaginosis may be treated with antibiotic medicines. These may be given in the form of a pill or a vaginal cream. A second round of antibiotics may be prescribed if the condition comes back after treatment.  °HOME CARE INSTRUCTIONS  °· Only take over-the-counter or prescription medicines as  directed by your health care provider. °· If antibiotic medicine was prescribed, take it as directed. Make sure you finish it even if you start to feel better. °· Do not have sex until treatment is completed. °· Tell all sexual partners that you have a vaginal infection. They should see their health care provider and be treated if they have problems, such as a mild rash or itching. °· Practice safe sex by using condoms and only having one sex partner. °SEEK MEDICAL CARE IF:  °· Your symptoms are not improving after 3 days of treatment. °· You have increased discharge or pain. °· You have a fever. °MAKE SURE YOU:  °· Understand these instructions. °· Will watch your condition. °· Will get help right away if you are not doing well or get worse. °FOR MORE INFORMATION  °Centers for Disease Control and Prevention, Division of STD Prevention: www.cdc.gov/std °American Sexual Health Association (ASHA): www.ashastd.org  °Document Released: 05/18/2005 Document Revised: 03/08/2013 Document Reviewed: 12/28/2012 °ExitCare® Patient Information ©2015 [employer]. This information is not intended to replace advice given to you by your health care provider. Make sure you discuss any questions you have with your health care provider. ° ° °Abdominal Pain, Women °Abdominal (stomach, pelvic, or belly) pain can be caused by many things. It is important to tell your doctor: °· The location of the pain. °· Does it come and go or is it present all the time? °· Are there things that start the pain (eating certain foods, exercise)? °· Are there other symptoms associated with the pain (fever, nausea, vomiting, diarrhea)? °All of this is helpful to know when trying to find the cause of the pain. °CAUSES  °·   Stomach: virus or bacteria infection, or ulcer. °· Intestine: appendicitis (inflamed appendix), regional ileitis (Crohn's disease), ulcerative colitis (inflamed colon), irritable bowel syndrome, diverticulitis (inflamed diverticulum of  the colon), or cancer of the stomach or intestine. °· Gallbladder disease or stones in the gallbladder. °· Kidney disease, kidney stones, or infection. °· Pancreas infection or cancer. °· Fibromyalgia (pain disorder). °· Diseases of the female organs: °· Uterus: fibroid (non-cancerous) tumors or infection. °· Fallopian tubes: infection or tubal pregnancy. °· Ovary: cysts or tumors. °· Pelvic adhesions (scar tissue). °· Endometriosis (uterus lining tissue growing in the pelvis and on the pelvic organs). °· Pelvic congestion syndrome (female organs filling up with blood just before the menstrual period). °· Pain with the menstrual period. °· Pain with ovulation (producing an egg). °· Pain with an IUD (intrauterine device, birth control) in the uterus. °· Cancer of the female organs. °· Functional pain (pain not caused by a disease, may improve without treatment). °· Psychological pain. °· Depression. °DIAGNOSIS  °Your doctor will decide the seriousness of your pain by doing an examination. °· Blood tests. °· X-rays. °· Ultrasound. °· CT scan (computed tomography, special type of X-ray). °· MRI (magnetic resonance imaging). °· Cultures, for infection. °· Barium enema (dye inserted in the large intestine, to better view it with X-rays). °· Colonoscopy (looking in intestine with a lighted tube). °· Laparoscopy (minor surgery, looking in abdomen with a lighted tube). °· Major abdominal exploratory surgery (looking in abdomen with a large incision). °TREATMENT  °The treatment will depend on the cause of the pain.  °· Many cases can be observed and treated at home. °· Over-the-counter medicines recommended by your caregiver. °· Prescription medicine. °· Antibiotics, for infection. °· Birth control pills, for painful periods or for ovulation pain. °· Hormone treatment, for endometriosis. °· Nerve blocking injections. °· Physical therapy. °· Antidepressants. °· Counseling with a psychologist or psychiatrist. °· Minor or major  surgery. °HOME CARE INSTRUCTIONS  °· Do not take laxatives, unless directed by your caregiver. °· Take over-the-counter pain medicine only if ordered by your caregiver. Do not take aspirin because it can cause an upset stomach or bleeding. °· Try a clear liquid diet (broth or water) as ordered by your caregiver. Slowly move to a bland diet, as tolerated, if the pain is related to the stomach or intestine. °· Have a thermometer and take your temperature several times a day, and record it. °· Bed rest and sleep, if it helps the pain. °· Avoid sexual intercourse, if it causes pain. °· Avoid stressful situations. °· Keep your follow-up appointments and tests, as your caregiver orders. °· If the pain does not go away with medicine or surgery, you may try: °· Acupuncture. °· Relaxation exercises (yoga, meditation). °· Group therapy. °· Counseling. °SEEK MEDICAL CARE IF:  °· You notice certain foods cause stomach pain. °· Your home care treatment is not helping your pain. °· You need stronger pain medicine. °· You want your IUD removed. °· You feel faint or lightheaded. °· You develop nausea and vomiting. °· You develop a rash. °· You are having side effects or an allergy to your medicine. °SEEK IMMEDIATE MEDICAL CARE IF:  °· Your pain does not go away or gets worse. °· You have a fever. °· Your pain is felt only in portions of the abdomen. The right side could possibly be appendicitis. The left lower portion of the abdomen could be colitis or diverticulitis. °· You are passing blood in your stools (bright   red or black tarry stools, with or without vomiting). °· You have blood in your urine. °· You develop chills, with or without a fever. °· You pass out. °MAKE SURE YOU:  °· Understand these instructions. °· Will watch your condition. °· Will get help right away if you are not doing well or get worse. °Document Released: 03/15/2007 Document Revised: 10/02/2013 Document Reviewed: 04/04/2009 °ExitCare® Patient Information  ©2015 [employer]. This information is not intended to replace advice given to you by your health care provider. Make sure you discuss any questions you have with your health care provider. ° °

## 2015-02-03 LAB — URINE CULTURE

## 2015-02-04 ENCOUNTER — Telehealth (HOSPITAL_COMMUNITY): Payer: Self-pay | Admitting: Obstetrics and Gynecology

## 2015-02-04 MED ORDER — CEPHALEXIN 500 MG PO CAPS: 500.0000 mg | ORAL_CAPSULE | Freq: Four times a day (QID) | ORAL | Status: DC

## 2015-02-04 NOTE — Telephone Encounter (Signed)
+   urine culture. Keflex sent to pharmacy. Patient made aware.

## 2015-02-05 LAB — GC/CHLAMYDIA PROBE AMP (~~LOC~~) NOT AT ARMC
Chlamydia: POSITIVE — AB
Neisseria Gonorrhea: NEGATIVE

## 2015-02-06 ENCOUNTER — Encounter: Payer: Self-pay | Admitting: Student

## 2015-02-06 ENCOUNTER — Telehealth: Payer: Self-pay | Admitting: Student

## 2015-02-06 DIAGNOSIS — O2340 Unspecified infection of urinary tract in pregnancy, unspecified trimester: Secondary | ICD-10-CM

## 2015-02-06 DIAGNOSIS — B951 Streptococcus, group B, as the cause of diseases classified elsewhere: Secondary | ICD-10-CM

## 2015-02-06 MED ORDER — AZITHROMYCIN 250 MG PO TABS: 1000.0000 mg | ORAL_TABLET | Freq: Once | ORAL | Status: DC

## 2015-02-06 NOTE — Telephone Encounter (Signed)
Patient called regarding culture results. Informed patient of positive chlamydia & sent rx for zithromax. Inform partner to seek treatment.  No intercourse until all parties have been treated.   Judeth Horn, NP

## 2017-02-19 ENCOUNTER — Encounter: Payer: Self-pay | Admitting: Family Medicine

## 2017-02-19 ENCOUNTER — Ambulatory Visit (INDEPENDENT_AMBULATORY_CARE_PROVIDER_SITE_OTHER): Payer: 59 | Admitting: Family Medicine

## 2017-02-19 ENCOUNTER — Ambulatory Visit
Admission: RE | Admit: 2017-02-19 | Discharge: 2017-02-19 | Disposition: A | Discharge status: Home / Self Care | Payer: 59 | Source: Ambulatory Visit | Attending: Family Medicine | Admitting: Family Medicine

## 2017-02-19 VITALS — BP 120/70 | HR 85 | Resp 16 | Wt 191.8 lb

## 2017-02-19 DIAGNOSIS — R0789 Other chest pain: Secondary | ICD-10-CM

## 2017-02-19 DIAGNOSIS — Z8709 Personal history of other diseases of the respiratory system: Secondary | ICD-10-CM | POA: Diagnosis not present

## 2017-02-19 DIAGNOSIS — F17200 Nicotine dependence, unspecified, uncomplicated: Secondary | ICD-10-CM

## 2017-02-19 NOTE — Patient Instructions (Signed)
Steps to Quit Smoking Smoking tobacco can be harmful to your health and can affect almost every organ in your body. Smoking puts you, and those around you, at risk for developing many serious chronic diseases. Quitting smoking is difficult, but it is one of the best things that you can do for your health. It is never too late to quit. What are the benefits of quitting smoking? When you quit smoking, you lower your risk of developing serious diseases and conditions, such as:  Lung cancer or lung disease, such as COPD.  Heart disease.  Stroke.  Heart attack.  Infertility.  Osteoporosis and bone fractures.  Additionally, symptoms such as coughing, wheezing, and shortness of breath may get better when you quit. You may also find that you get sick less often because your body is stronger at fighting off colds and infections. If you are pregnant, quitting smoking can help to reduce your chances of having a baby of low birth weight. How do I get ready to quit? When you decide to quit smoking, create a plan to make sure that you are successful. Before you quit:  Pick a date to quit. Set a date within the next two weeks to give you time to prepare.  Write down the reasons why you are quitting. Keep this list in places where you will see it often, such as on your bathroom mirror or in your car or wallet.  Identify the people, places, things, and activities that make you want to smoke (triggers) and avoid them. Make sure to take these actions: ? Throw away all cigarettes at home, at work, and in your car. ? Throw away smoking accessories, such as ashtrays and lighters. ? Clean your car and make sure to empty the ashtray. ? Clean your home, including curtains and carpets.  Tell your family, friends, and coworkers that you are quitting. Support from your loved ones can make quitting easier.  Talk with your health care provider about your options for quitting smoking.  Find out what treatment  options are covered by your health insurance.  What strategies can I use to quit smoking? Talk with your healthcare provider about different strategies to quit smoking. Some strategies include:  Quitting smoking altogether instead of gradually lessening how much you smoke over a period of time. Research shows that quitting "cold turkey" is more successful than gradually quitting.  Attending in-person counseling to help you build problem-solving skills. You are more likely to have success in quitting if you attend several counseling sessions. Even short sessions of 10 minutes can be effective.  Finding resources and support systems that can help you to quit smoking and remain smoke-free after you quit. These resources are most helpful when you use them often. They can include: ? Online chats with a counselor. ? Telephone quitlines. ? Printed self-help materials. ? Support groups or group counseling. ? Text messaging programs. ? Mobile phone applications.  Taking medicines to help you quit smoking. (If you are pregnant or breastfeeding, talk with your health care provider first.) Some medicines contain nicotine and some do not. Both types of medicines help with cravings, but the medicines that include nicotine help to relieve withdrawal symptoms. Your health care provider may recommend: ? Nicotine patches, gum, or lozenges. ? Nicotine inhalers or sprays. ? Non-nicotine medicine that is taken by mouth.  Talk with your health care provider about combining strategies, such as taking medicines while you are also receiving in-person counseling. Using these two strategies together   makes you more likely to succeed in quitting than if you used either strategy on its own. If you are pregnant or breastfeeding, talk with your health care provider about finding counseling or other support strategies to quit smoking. Do not take medicine to help you quit smoking unless told to do so by your health care  provider. What things can I do to make it easier to quit? Quitting smoking might feel overwhelming at first, but there is a lot that you can do to make it easier. Take these important actions:  Reach out to your family and friends and ask that they support and encourage you during this time. Call telephone quitlines, reach out to support groups, or work with a counselor for support.  Ask people who smoke to avoid smoking around you.  Avoid places that trigger you to smoke, such as bars, parties, or smoke-break areas at work.  Spend time around people who do not smoke.  Lessen stress in your life, because stress can be a smoking trigger for some people. To lessen stress, try: ? Exercising regularly. ? Deep-breathing exercises. ? Yoga. ? Meditating. ? Performing a body scan. This involves closing your eyes, scanning your body from head to toe, and noticing which parts of your body are particularly tense. Purposefully relax the muscles in those areas.  Download or purchase mobile phone or tablet apps (applications) that can help you stick to your quit plan by providing reminders, tips, and encouragement. There are many free apps, such as QuitGuide from the CDC (Centers for Disease Control and Prevention). You can find other support for quitting smoking (smoking cessation) through smokefree.gov and other websites.  How will I feel when I quit smoking? Within the first 24 hours of quitting smoking, you may start to feel some withdrawal symptoms. These symptoms are usually most noticeable 2-3 days after quitting, but they usually do not last beyond 2-3 weeks. Changes or symptoms that you might experience include:  Mood swings.  Restlessness, anxiety, or irritation.  Difficulty concentrating.  Dizziness.  Strong cravings for sugary foods in addition to nicotine.  Mild weight gain.  Constipation.  Nausea.  Coughing or a sore throat.  Changes in how your medicines work in your  body.  A depressed mood.  Difficulty sleeping (insomnia).  After the first 2-3 weeks of quitting, you may start to notice more positive results, such as:  Improved sense of smell and taste.  Decreased coughing and sore throat.  Slower heart rate.  Lower blood pressure.  Clearer skin.  The ability to breathe more easily.  Fewer sick days.  Quitting smoking is very challenging for most people. Do not get discouraged if you are not successful the first time. Some people need to make many attempts to quit before they achieve long-term success. Do your best to stick to your quit plan, and talk with your health care provider if you have any questions or concerns. This information is not intended to replace advice given to you by your health care provider. Make sure you discuss any questions you have with your health care provider. Document Released: 05/12/2001 Document Revised: 01/14/2016 Document Reviewed: 10/02/2014 Elsevier Interactive Patient Education  2017 [employer]  

## 2017-02-19 NOTE — Progress Notes (Signed)
   Subjective:    Patient ID: Teresa Walls, female    DOB: 25-Dec-1990, 26 y.o.   MRN: [id number]  HPI Chief Complaint  Patient presents with  . new pt    new pt- chest pressure (not chest pain) and will shoot up to throat, started after sickness last night. when she smokes it seems to make things worse sometimes. declines flu shot   She is new to the practice and here for an acute complaint.  Previous medical care: [facility] in [place]. She lives her now.   States last month she had sinus symptoms and cough and the cough lingered and she has had an uncomfortable feeling in her chest. Intermittent pressure or sharp pain under her left breast. Comes on at random times and lasts for a second or two and goes away spontaneously. This is occurring 1-3 times per week. States coughing has mainly resolved.  States she mainly notices this when she is smoking.  States she has a history of acid reflux and tries to avoid food that triggers.   History of chronic bronchitis. No history of asthma or pneumonia.   Denies fever, chills, dizziness, palpitations, shortness of breath, abdominal pain, N/V/D, urinary symptoms, LE edema.    Reviewed allergies, medications, past medical, surgical, and social history.  Other providers: dentist. OB/GYN - Nestor Ramp.   LMP: 01/28/2017. Uses condoms.  Single.  Works for social services in case work.    Review of Systems Pertinent positives and negatives in the history of present illness.     Objective:   Physical Exam BP 120/70   Pulse 85   Resp 16   Wt 191 lb 12.8 oz (87 kg)   SpO2 98%   BMI 33.98 kg/m  Alert and in no distress.  Pharyngeal area is normal. Neck is supple without adenopathy or thyromegaly. Cardiac exam shows a regular sinus rhythm without murmurs or gallops. Lungs are clear to auscultation.      Assessment & Plan:  Chest tightness - Plan: DG Chest 2 View  Smoker - Plan: DG Chest 2 View  History of bronchitis -  Plan: DG Chest 2 View  Discussed that intermittent pain that is infrequent and lasts only a couple of seconds speaks to this being nothing serious. She would like a chest XR to rule out anything bad. Gave her options to hold off on XR and see if her symptoms go away but she would like to proceed with XR.  Smoking cessation counseling done.  Follow up pending XR and she will let me know if symptoms worsen.

## 2017-02-24 ENCOUNTER — Telehealth: Payer: Self-pay | Admitting: Family Medicine

## 2017-02-24 NOTE — Telephone Encounter (Signed)
Pt called and left message requesting call back regarding test results.

## 2017-02-24 NOTE — Telephone Encounter (Signed)
Gave x ray results °

## 2017-03-16 LAB — HIV ANTIBODY (ROUTINE TESTING W REFLEX): HIV: NONREACTIVE

## 2017-03-17 LAB — HM HIV SCREENING LAB: HM HIV SCREENING: NEGATIVE

## 2017-03-18 LAB — HM PAP SMEAR: HM PAP: NEGATIVE

## 2018-03-02 IMAGING — CR DG CHEST 2V
2 series · 2 of 2 positions shown · non-contrast
Comparison: None.

CLINICAL DATA: Chest tightness with episodes of chest pain for the
past month. Smoker.

EXAM:
CHEST  2 VIEW

[w chest pa]
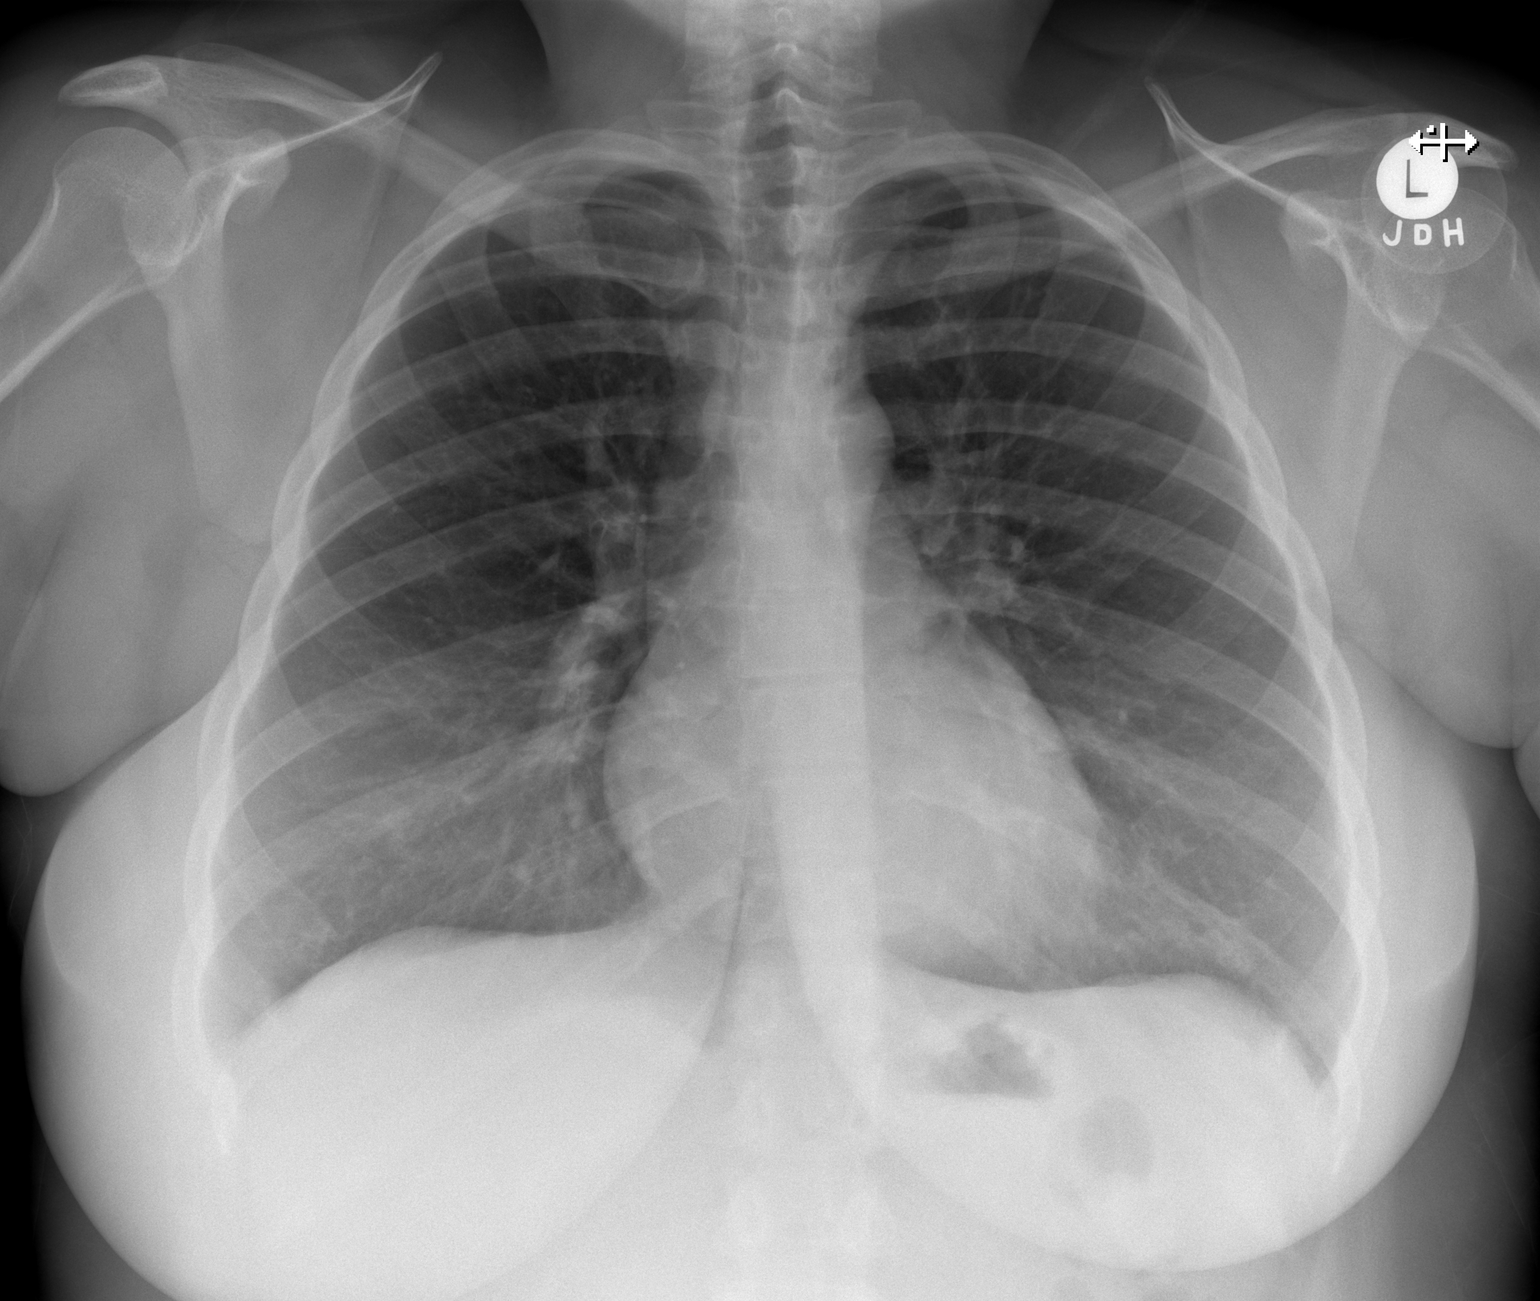

[w chest lat]
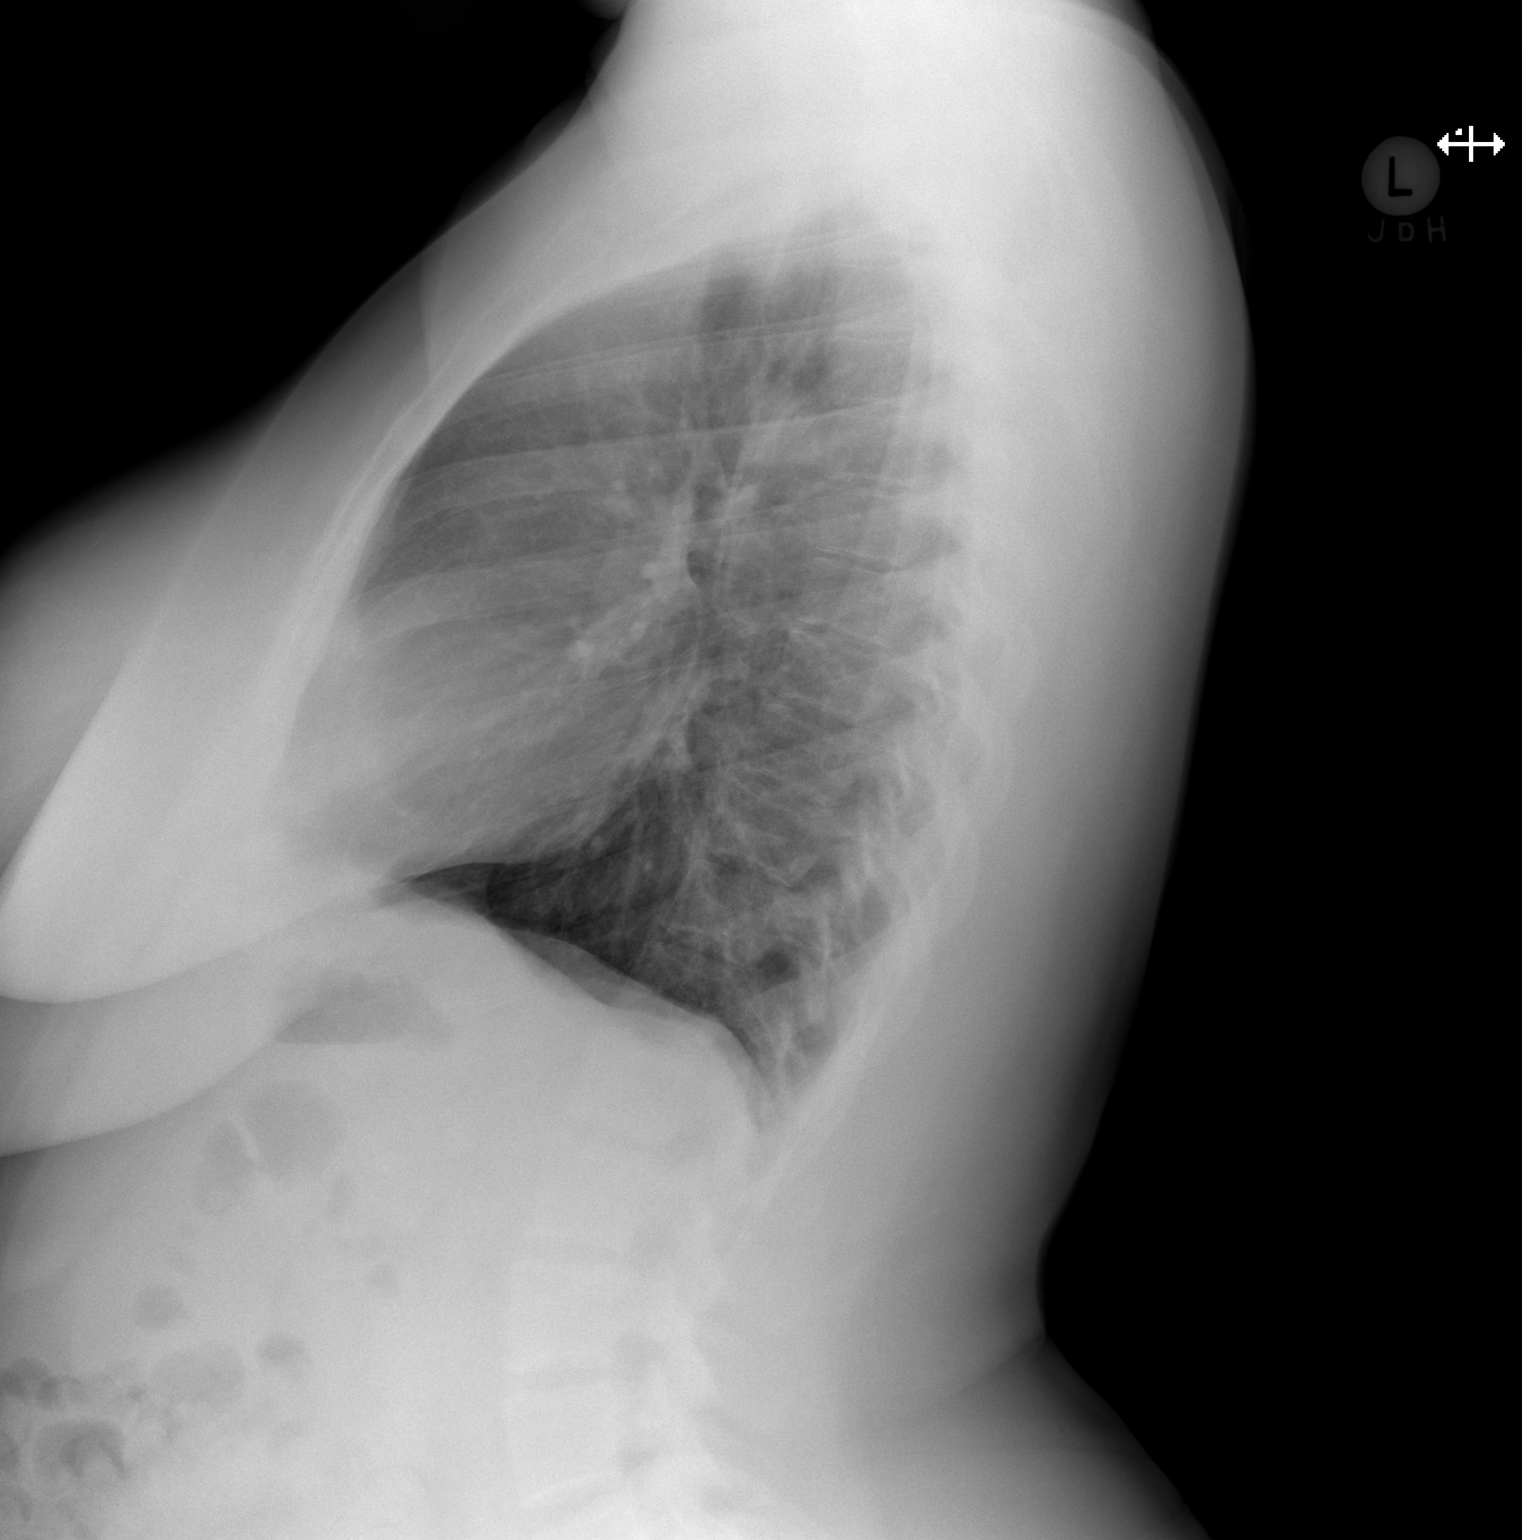

[2 of 2 positions shown; findings below may reference images not displayed]

FINDINGS: Normal sized heart. Clear lungs. Mild central peribronchial
thickening. Unremarkable bones.
IMPRESSION: Mild bronchitic changes.

## 2018-04-26 ENCOUNTER — Telehealth: Payer: Self-pay | Admitting: Family Medicine

## 2018-04-26 NOTE — Telephone Encounter (Signed)
Pt dissmissal letter in guarantor snapshot

## 2018-05-18 DIAGNOSIS — Z113 Encounter for screening for infections with a predominantly sexual mode of transmission: Secondary | ICD-10-CM | POA: Diagnosis not present

## 2018-05-18 DIAGNOSIS — R350 Frequency of micturition: Secondary | ICD-10-CM | POA: Diagnosis not present

## 2018-05-18 DIAGNOSIS — N926 Irregular menstruation, unspecified: Secondary | ICD-10-CM | POA: Diagnosis not present

## 2018-06-29 ENCOUNTER — Ambulatory Visit (INDEPENDENT_AMBULATORY_CARE_PROVIDER_SITE_OTHER): Payer: BLUE CROSS/BLUE SHIELD | Admitting: Physician Assistant

## 2018-06-29 ENCOUNTER — Encounter: Payer: Self-pay | Admitting: Physician Assistant

## 2018-06-29 VITALS — BP 110/80 | HR 73 | Temp 98.7°F | Ht 61.0 in | Wt 177.0 lb

## 2018-06-29 DIAGNOSIS — Z3041 Encounter for surveillance of contraceptive pills: Secondary | ICD-10-CM

## 2018-06-29 DIAGNOSIS — Z720 Tobacco use: Secondary | ICD-10-CM

## 2018-06-29 LAB — POCT URINE PREGNANCY: PREG TEST UR: NEGATIVE

## 2018-06-29 MED ORDER — LO LOESTRIN FE 1 MG-10 MCG / 10 MCG PO TABS: 1.0000 | ORAL_TABLET | Freq: Every day | ORAL | 3 refills | Status: AC

## 2018-06-29 NOTE — Progress Notes (Signed)
Teresa Walls is a 28 y.o. female here to Establish Care.  I acted as a Neurosurgeon for [employer], PA-C Corky Mull, LPN  History of Present Illness:   Chief Complaint  Patient presents with  . Establish Care   Chronic Issues: Tobacco abuse -- at max, was smoking 5 cigarettes day. She is currently down to 1-2 cigarettes. Has history of acute bronchitis. Oral contraception surveillance -- currently sexually active. She denies pregnancy. Does skips placebo pills occasionally so doesn't have monthly period. Currently on Lo Loestrin Fe.  Health Maintenance: Immunizations -- UTD, declined Flu Colonoscopy -- N/A Mammogram -- N/A PAP -- 2019 normal per pt, No hx of abnormal paps. + Chlamydia in 2016. Weight -- Weight: 177 lb (80.3 kg)   Depression screen [facility] 2/9 06/29/2018  Decreased Interest 0  Down, Depressed, Hopeless 0  PHQ - 2 Score 0    No flowsheet data found.   Other providers/specialists: Patient Care Team: Jarold Motto, [place] as PCP - General (Physician Assistant) Ob/Gyn, Nestor Ramp as Consulting Physician (Obstetrics and Gynecology)   Past Medical History:  Diagnosis Date  . Chlamydia 01/2015  . History of chicken pox      Social History   Socioeconomic History  . Marital status: Single    Spouse name: Not on file  . Number of children: Not on file  . Years of education: Not on file  . Highest education level: Not on file  Occupational History  . Not on file  Social Needs  . Financial resource strain: Not on file  . Food insecurity:    Worry: Not on file    Inability: Not on file  . Transportation needs:    Medical: Not on file    Non-medical: Not on file  Tobacco Use  . Smoking status: Current Every Day Smoker    Types: Cigarettes  . Smokeless tobacco: Never Used  . Tobacco comment: 1-2 cigaretts a day, pt trying to quit  Substance and Sexual Activity  . Alcohol use: Yes    Comment: social  . Drug use: No  . Sexual activity: Not  Currently    Birth control/protection: None  Lifestyle  . Physical activity:    Days per week: Not on file    Minutes per session: Not on file  . Stress: Not on file  Relationships  . Social connections:    Talks on phone: Not on file    Gets together: Not on file    Attends religious service: Not on file    Active member of club or organization: Not on file    Attends meetings of clubs or organizations: Not on file    Relationship status: Not on file  . Intimate partner violence:    Fear of current or ex partner: Not on file    Emotionally abused: Not on file    Physically abused: Not on file    Forced sexual activity: Not on file  Other Topics Concern  . 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 WISDOM TOOTH EXTRACTION Bilateral 2016    Family History  Problem Relation Age of Onset  . Hypertension Father   . Heart attack Maternal Grandmother   . Hypertension Maternal Grandmother   . Diabetes Maternal Grandfather   . Stroke Maternal Grandfather   . Breast cancer Paternal Aunt  early 6430's  . Colon cancer Neg Hx     Allergies  Allergen Reactions  . Sulfa Antibiotics Hives    Fever, redness     Current Medications:   Current Outpatient Medications:  .  LO LOESTRIN FE 1 MG-10 MCG / 10 MCG tablet, Take 1 tablet by mouth daily., Disp: 3 Package, Rfl: 3   Review of Systems:   Review of Systems  Constitutional: Negative for chills, fever, malaise/fatigue and weight loss.  Respiratory: Negative for shortness of breath.   Cardiovascular: Negative for chest pain, orthopnea, claudication and leg swelling.  Gastrointestinal: Negative for heartburn, nausea and vomiting.  Neurological: Negative for dizziness, tingling and headaches.      Vitals:   Vitals:   06/29/18 1020  BP: 110/80  Pulse: 73  Temp: 98.7 F (37.1 C)   TempSrc: Oral  SpO2: 97%  Weight: 177 lb (80.3 kg)  Height: 5\' 1"  (1.549 m)      Body mass index is 33.44 kg/m.  Physical Exam:   Physical Exam Vitals signs and nursing note reviewed.  Constitutional:      General: She is not in acute distress.    Appearance: She is well-developed. She is not ill-appearing or toxic-appearing.  Cardiovascular:     Rate and Rhythm: Normal rate and regular rhythm.     Pulses: Normal pulses.     Heart sounds: Normal heart sounds, S1 normal and S2 normal.     Comments: No LE edema Pulmonary:     Effort: Pulmonary effort is normal.     Breath sounds: Normal breath sounds.  Skin:    General: Skin is warm and dry.  Neurological:     Mental Status: She is alert.     GCS: GCS eye subscore is 4. GCS verbal subscore is 5. GCS motor subscore is 6.  Psychiatric:        Speech: Speech normal.        Behavior: Behavior normal. Behavior is cooperative.     Results for orders placed or performed in visit on 06/29/18  POCT urine pregnancy  Result Value Ref Range   Preg Test, Ur Negative Negative      Assessment and Plan:   Teresa Walls was seen today for establish care.  Diagnoses and all orders for this visit:  Oral contraceptive pill surveillance Urine preg negative. Will refill OCPs. Recommended condoms for STD prevention. -     POCT urine pregnancy  Tobacco abuse She is motivated to quit. Encouraged quitting. Avoid transition to e-cigarettes.  Other orders -     LO LOESTRIN FE 1 MG-10 MCG / 10 MCG tablet; Take 1 tablet by mouth daily.    . Reviewed expectations re: course of current medical issues. . Discussed self-management of symptoms. . Outlined signs and symptoms indicating need for more acute intervention. . Patient verbalized understanding and all questions were answered. . See orders for this visit as documented in the electronic medical record. . Patient received an After-Visit Summary.  CMA or LPN served as scribe during  this visit. History, Physical, and Plan performed by medical provider. The above documentation has been reviewed and is accurate and complete.   Jarold MottoSamantha Perry Molla, PA-C

## 2018-06-29 NOTE — Patient Instructions (Signed)
It was great to see you!  Please schedule a physical at your convenience.

## 2018-07-07 ENCOUNTER — Telehealth: Payer: Self-pay | Admitting: Physician Assistant

## 2018-07-07 NOTE — Telephone Encounter (Signed)
[facility] called and spoke to [place], Pensions consultant about the refill request, she says the medication is ready for pickup that the patient called.

## 2018-07-07 NOTE — Telephone Encounter (Signed)
Copied from CRM [id number]. Topic: Quick Communication - See Telephone Encounter >> Jul 07, 2018  4:31 PM Jens Som A wrote: CRM for notification. See Telephone encounter for: 07/07/18.Patient is calling to request script for birth control to be send to [facility] on [address]  Patient was also advise to call and request a script transfer. [address] [place], [place] [address] [phone]

## 2018-07-08 ENCOUNTER — Encounter: Payer: Self-pay | Admitting: Physician Assistant

## 2018-07-22 ENCOUNTER — Encounter: Payer: Self-pay | Admitting: Physician Assistant

## 2018-07-22 LAB — CHLAMYDIA/GONOCOCCUS/TRICHOMONAS, NAA
CHLAMYDIA TRACHOMATIS, NAA: NEGATIVE
Neisseria gonorrhoeae, NAA: NEGATIVE

## 2018-07-22 LAB — HEPATITIS B SURFACE ANTIGEN: HBsAg Screen: NEGATIVE

## 2018-08-15 DIAGNOSIS — N898 Other specified noninflammatory disorders of vagina: Secondary | ICD-10-CM | POA: Diagnosis not present

## 2018-08-15 DIAGNOSIS — R05 Cough: Secondary | ICD-10-CM | POA: Diagnosis not present
